# Patient Record
Sex: Female | Born: 1935 | Race: Black or African American | Hispanic: No | Marital: Single | Smoking: Never smoker
Health system: Southern US, Community
[De-identification: ages and names within clinical notes are randomized; demographics above are authoritative.]

## PROBLEM LIST (undated history)

## (undated) DIAGNOSIS — G629 Polyneuropathy, unspecified: Secondary | ICD-10-CM

## (undated) DIAGNOSIS — I509 Heart failure, unspecified: Secondary | ICD-10-CM

## (undated) DIAGNOSIS — D649 Anemia, unspecified: Secondary | ICD-10-CM

## (undated) DIAGNOSIS — F329 Major depressive disorder, single episode, unspecified: Secondary | ICD-10-CM

## (undated) DIAGNOSIS — E7800 Pure hypercholesterolemia, unspecified: Secondary | ICD-10-CM

## (undated) DIAGNOSIS — F32A Depression, unspecified: Secondary | ICD-10-CM

## (undated) DIAGNOSIS — R569 Unspecified convulsions: Secondary | ICD-10-CM

## (undated) DIAGNOSIS — M1990 Unspecified osteoarthritis, unspecified site: Secondary | ICD-10-CM

## (undated) DIAGNOSIS — F0390 Unspecified dementia without behavioral disturbance: Secondary | ICD-10-CM

---

## 2018-05-30 ENCOUNTER — Emergency Department (HOSPITAL_COMMUNITY)
Admission: EM | Admit: 2018-05-30 | Discharge: 2018-05-30 | Disposition: A | Discharge status: Home / Self Care | Payer: Medicare Other | Attending: Emergency Medicine | Admitting: Emergency Medicine

## 2018-05-30 ENCOUNTER — Emergency Department (HOSPITAL_COMMUNITY): Payer: Medicare Other

## 2018-05-30 ENCOUNTER — Encounter (HOSPITAL_COMMUNITY): Payer: Self-pay | Admitting: Emergency Medicine

## 2018-05-30 ENCOUNTER — Other Ambulatory Visit: Payer: Self-pay

## 2018-05-30 DIAGNOSIS — F0390 Unspecified dementia without behavioral disturbance: Secondary | ICD-10-CM | POA: Diagnosis not present

## 2018-05-30 DIAGNOSIS — R2242 Localized swelling, mass and lump, left lower limb: Secondary | ICD-10-CM | POA: Diagnosis present

## 2018-05-30 DIAGNOSIS — I509 Heart failure, unspecified: Secondary | ICD-10-CM | POA: Insufficient documentation

## 2018-05-30 DIAGNOSIS — M7989 Other specified soft tissue disorders: Secondary | ICD-10-CM

## 2018-05-30 DIAGNOSIS — L97421 Non-pressure chronic ulcer of left heel and midfoot limited to breakdown of skin: Secondary | ICD-10-CM | POA: Diagnosis not present

## 2018-05-30 HISTORY — DX: Heart failure, unspecified: I50.9

## 2018-05-30 HISTORY — DX: Pure hypercholesterolemia, unspecified: E78.00

## 2018-05-30 HISTORY — DX: Unspecified osteoarthritis, unspecified site: M19.90

## 2018-05-30 HISTORY — DX: Polyneuropathy, unspecified: G62.9

## 2018-05-30 HISTORY — DX: Major depressive disorder, single episode, unspecified: F32.9

## 2018-05-30 HISTORY — DX: Unspecified dementia, unspecified severity, without behavioral disturbance, psychotic disturbance, mood disturbance, and anxiety: F03.90

## 2018-05-30 HISTORY — DX: Unspecified convulsions: R56.9

## 2018-05-30 HISTORY — DX: Depression, unspecified: F32.A

## 2018-05-30 HISTORY — DX: Anemia, unspecified: D64.9

## 2018-05-30 LAB — CBC WITH DIFFERENTIAL/PLATELET
BASOS PCT: 0 %
Basophils Absolute: 0 10*3/uL (ref 0.0–0.1)
EOS ABS: 0.5 10*3/uL (ref 0.0–0.7)
EOS PCT: 5 %
HCT: 30.3 % — ABNORMAL LOW (ref 36.0–46.0)
Hemoglobin: 9.1 g/dL — ABNORMAL LOW (ref 12.0–15.0)
LYMPHS PCT: 40 %
Lymphs Abs: 3.9 10*3/uL (ref 0.7–4.0)
MCH: 25.7 pg — ABNORMAL LOW (ref 26.0–34.0)
MCHC: 30 g/dL (ref 30.0–36.0)
MCV: 85.6 fL (ref 78.0–100.0)
MONO ABS: 0.7 10*3/uL (ref 0.1–1.0)
Monocytes Relative: 7 %
Neutro Abs: 4.6 10*3/uL (ref 1.7–7.7)
Neutrophils Relative %: 48 %
Platelets: 355 10*3/uL (ref 150–400)
RBC: 3.54 MIL/uL — AB (ref 3.87–5.11)
RDW: 16.3 % — AB (ref 11.5–15.5)
WBC: 9.6 10*3/uL (ref 4.0–10.5)

## 2018-05-30 LAB — BASIC METABOLIC PANEL
Anion gap: 11 (ref 5–15)
BUN: 26 mg/dL — AB (ref 6–20)
CALCIUM: 9 mg/dL (ref 8.9–10.3)
CO2: 32 mmol/L (ref 22–32)
Chloride: 102 mmol/L (ref 101–111)
Creatinine, Ser: 0.87 mg/dL (ref 0.44–1.00)
GFR calc Af Amer: >60 mL/min (ref >60)
GFR calc non Af Amer: 59 mL/min — ABNORMAL LOW (ref >60)
GLUCOSE: 97 mg/dL (ref 65–99)
Potassium: 3.9 mmol/L (ref 3.5–5.1)
SODIUM: 145 mmol/L (ref 135–145)

## 2018-05-30 MED ORDER — GABAPENTIN 300 MG PO CAPS
300.0000 mg | ORAL_CAPSULE | Freq: Once | ORAL | Status: AC
  Administered 2018-05-30: 300 mg via ORAL
  Filled 2018-05-30: qty 1

## 2018-05-30 NOTE — Discharge Instructions (Signed)
You were seen in the ED today with foot swelling near an ulcer. There is no evidence of infection at this time. Labs and x-rays were normal. We performed an ultrasound to look for blood clots in the leg and that was normal as well. Continue your wound care and follow up with the home wound care nurse for further treatment. Return to the ED with any fever, chills, worsening pain, wound drainage, chest pain, or difficulty breathing.

## 2018-05-30 NOTE — ED Provider Notes (Signed)
Emergency Department Provider Note   I have reviewed the triage vital signs and the nursing notes.   HISTORY  Chief Complaint Wound Check   HPI Emily Moses is a 84 y.o. female with PMH of CHF, Dementia, Depression, HLD, and neuropathy presents to the emergency department for evaluation of left heel ulcer.  The patient was apparently evaluated by her wound care nurse this morning and sent to the emergency department.  The patient is having pain in the area which is worsening.  She denies fevers but has noticed some left leg swelling no radiation of symptoms or modifying factors. Denies any drainage from the wound.   Level 5 caveat: Dementia and speech difficulty.   Past Medical History:  Diagnosis Date  . Anemia   . Arthritis   . CHF (congestive heart failure) (HCC)   . Dementia   . Depression   . High cholesterol   . Neuropathy   . Seizures (HCC)     There are no active problems to display for this patient.   Allergies Patient has no known allergies.  No family history on file.  Social History Social History   Tobacco Use  . Smoking status: Never Smoker  . Smokeless tobacco: Never Used  Substance Use Topics  . Alcohol use: Not Currently  . Drug use: Not Currently    Review of Systems  Constitutional: No fever/chills Eyes: No visual changes. ENT: No sore throat. Cardiovascular: Denies chest pain. Respiratory: Denies shortness of breath. Gastrointestinal: No abdominal pain.  No nausea, no vomiting.  No diarrhea.  No constipation. Genitourinary: Negative for dysuria. Musculoskeletal: Negative for back pain. Positive left heel pain and ulcer.  Skin: Negative for rash. Neurological: Negative for headaches, focal weakness or numbness.  10-point ROS otherwise negative.  ____________________________________________   PHYSICAL EXAM:  VITAL SIGNS: ED Triage Vitals  Enc Vitals Group     BP 05/30/18 1452 116/79     Pulse Rate 05/30/18 1452 81   Resp 05/30/18 1452 18     Temp 05/30/18 1452 98.1 F (36.7 C)     Temp src --      SpO2 05/30/18 1452 93 %     Weight 05/30/18 1453 175 lb (79.4 kg)     Height 05/30/18 1453 5\' 2"  (1.575 m)     Pain Score 05/30/18 1452 10   Constitutional: Alert and oriented. Well appearing and in no acute distress. Eyes: Conjunctivae are normal. Head: Atraumatic. Nose: No congestion/rhinnorhea. Mouth/Throat: Mucous membranes are moist.  Neck: No stridor.  Cardiovascular: Normal rate, regular rhythm. Good peripheral circulation. Grossly normal heart sounds.   Respiratory: Normal respiratory effort.  No retractions. Lungs CTAB. Gastrointestinal: Soft and nontender. No distention.  Musculoskeletal: No lower extremity tenderness. Positive left leg edema. 3 x 2 cm heel ulceration with eschar. No surrounding cellulitis or abscess appreciated. No visible bone.  Neurologic:  Normal speech and language. No gross focal neurologic deficits are appreciated.  Skin:  Skin is warm and dry. Left heel ulceration and eschar as noted above.   ____________________________________________   LABS (all labs ordered are listed, but only abnormal results are displayed)  Labs Reviewed  BASIC METABOLIC PANEL - Abnormal; Notable for the following components:      Result Value   BUN 26 (*)    GFR calc non Af Amer 59 (*)    All other components within normal limits  CBC WITH DIFFERENTIAL/PLATELET - Abnormal; Notable for the following components:   RBC 3.54 (*)  Hemoglobin 9.1 (*)    HCT 30.3 (*)    MCH 25.7 (*)    RDW 16.3 (*)    All other components within normal limits   ____________________________________________  RADIOLOGY  [place] Venous Img Lower  Left (dvt Study)  Result Date: 05/30/2018 CLINICAL DATA:  Left lower extremity swelling. EXAM: Left LOWER EXTREMITY VENOUS DOPPLER ULTRASOUND TECHNIQUE: Gray-scale sonography with graded compression, as well as color Doppler and duplex ultrasound were performed to  evaluate the lower extremity deep venous systems from the level of the common femoral vein and including the common femoral, femoral, profunda femoral, popliteal and calf veins including the posterior tibial, peroneal and gastrocnemius veins when visible. The superficial great saphenous vein was also interrogated. Spectral Doppler was utilized to evaluate flow at rest and with distal augmentation maneuvers in the common femoral, femoral and popliteal veins. COMPARISON:  None. FINDINGS: Contralateral Common Femoral Vein: Respiratory phasicity is normal and symmetric with the symptomatic side. No evidence of thrombus. Normal compressibility. Common Femoral Vein: No evidence of thrombus. Normal compressibility, respiratory phasicity and response to augmentation. Saphenofemoral Junction: No evidence of thrombus. Normal compressibility and flow on color Doppler imaging. Profunda Femoral Vein: No evidence of thrombus. Normal compressibility and flow on color Doppler imaging. Femoral Vein: No evidence of thrombus. Normal compressibility, respiratory phasicity and response to augmentation. Popliteal Vein: No evidence of thrombus. Normal compressibility, respiratory phasicity and response to augmentation. Calf Veins: No evidence of thrombus. Normal compressibility and flow on color Doppler imaging. Superficial Great Saphenous Vein: No evidence of thrombus. Normal compressibility. Venous Reflux:  None. Other Findings:  Soft tissue edema is noted in the calf. IMPRESSION: No definite evidence of deep venous thrombosis seen in the left lower extremity. Electronically Signed   By: Lupita RaiderJames  Green Jr, M.D.   On: 05/30/2018 17:49   Dg Foot Complete Left  Result Date: 05/30/2018 CLINICAL DATA:  Left heel ulceration, assess for osteomyelitis. EXAM: LEFT FOOT - COMPLETE 3+ VIEW COMPARISON:  None. FINDINGS: There is no evidence of fracture or dislocation. Osteopenia. There is no evidence of arthropathy or other focal bone abnormality.  Soft tissue swelling. No subcutaneous gas or radiopaque foreign bodies. Mild vascular calcifications. IMPRESSION: Soft tissue swelling. No acute osseous process. Osteopenia without destructive bony lesions. Electronically Signed   By: Awilda Metroourtnay  Bloomer M.D.   On: 05/30/2018 16:17    ____________________________________________   PROCEDURES  Procedure(s) performed:   Procedures  None  ____________________________________________   INITIAL IMPRESSION / ASSESSMENT AND PLAN / ED COURSE  Pertinent labs & imaging results that were available during my care of the patient were reviewed by me and considered in my medical decision making (see chart for details).  Patient presents to the emergency department for evaluation of left heel ulcer.  She was apparently sent by her wound care nurse.  The wound does not appear acutely infected but unclear if ulcer extends to bone.  Plan for baseline labs and plain film to evaluate for osteomyelitis.  Plan to also obtain lower extremity ultrasound given the worsening swelling to rule out DVT.   Spoke with [facility] nurse at [facility].  She reports that the wound care nurse was concerned regarding the left foot swelling in the setting of known ulcer and devised ED presentation to rule out infection.  Lab work to this point is normal.  No leukocytosis.  X-ray of the foot shows no bony destruction or concern for osteomyelitis.  My clinical concern for osteomyelitis is low.  The patient has no  outward signs of acute wound infection.  Ultrasound of the left lower extremity is pending.   No evidence of DVT on [place]. Plan for discharge with continued wound mgmt. Provided written discharge instructions and summary of ED care at discharge.   ____________________________________________  FINAL CLINICAL IMPRESSION(S) / ED DIAGNOSES  Final diagnoses:  Swelling of left foot  Skin ulcer of left heel, limited to breakdown of skin (HCC)    MEDICATIONS GIVEN DURING THIS  VISIT:  Medications  gabapentin (NEURONTIN) capsule 300 mg (300 mg Oral Given 05/30/18 1558)    Note:  This document was prepared using Dragon voice recognition software and may include unintentional dictation errors.  Alona Bene, MD Emergency Medicine    Mikaia Janvier, Arlyss Repress, MD 05/30/18 [phone]

## 2018-05-30 NOTE — ED Triage Notes (Signed)
Pt sent to ED by wound care nurse for left heel wound.

## 2018-08-01 IMAGING — US US EXTREM LOW VENOUS*L*
1 series · 13 of 16 positions shown · non-contrast
Comparison: None.

CLINICAL DATA: Left lower extremity swelling.



[Series 1: us extrem low venous*left* · 0.08mm/px · 13 of 16 slices shown]
[im 1/16]
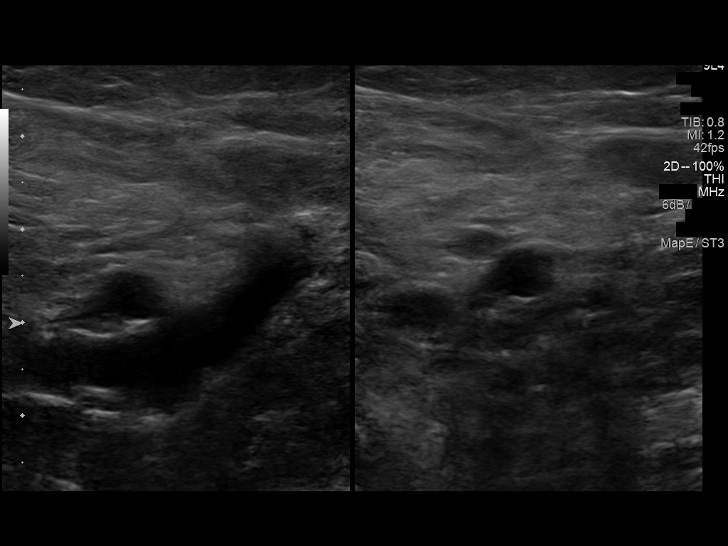
[im 2/16]
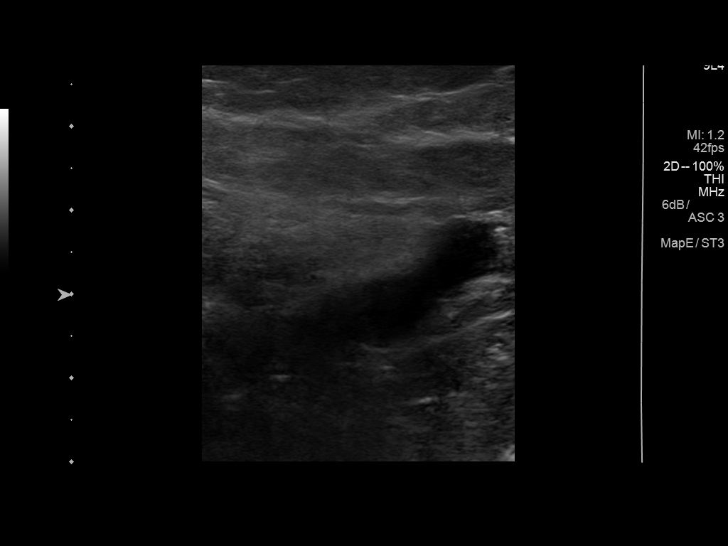
[im 4/16]
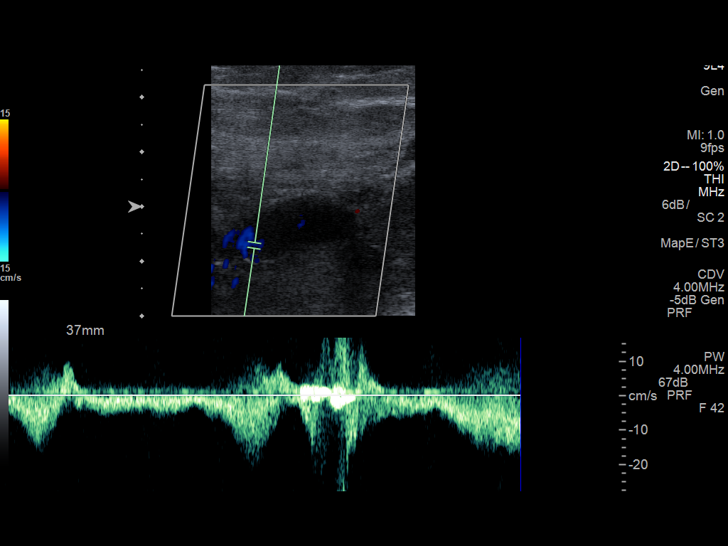
[im 5/16]
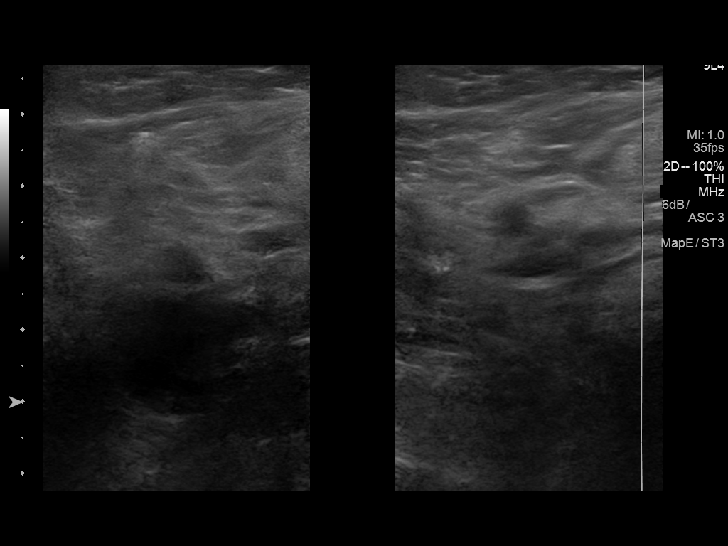
[im 6/16]
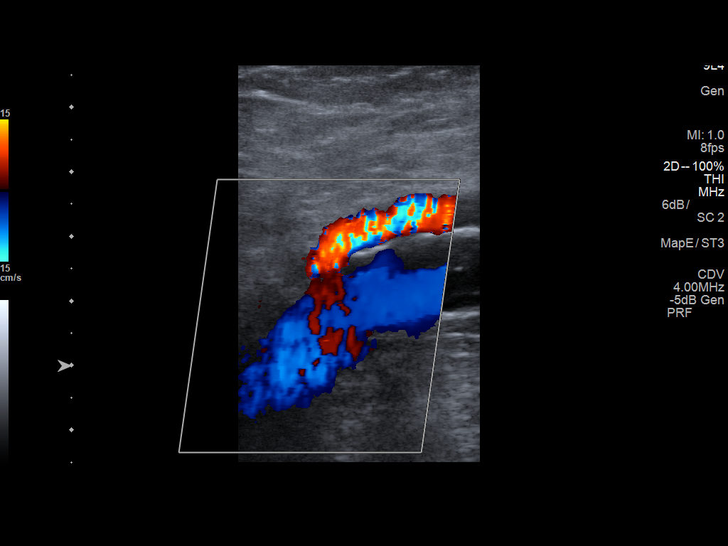
[im 7/16]
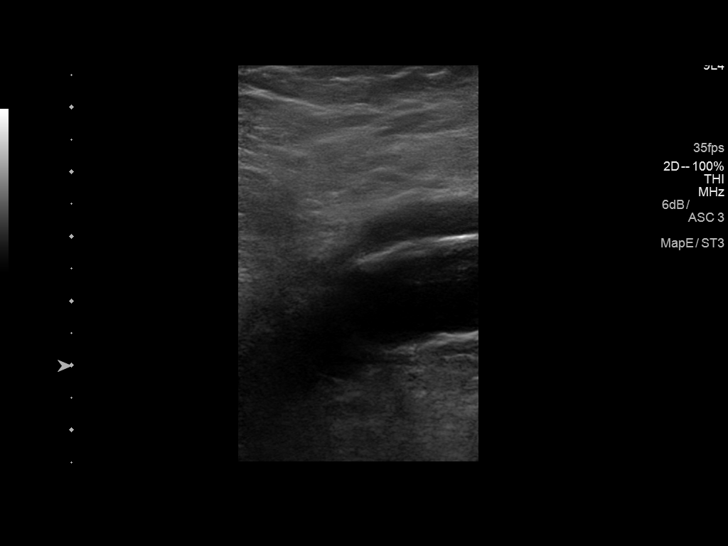
[im 9/16]
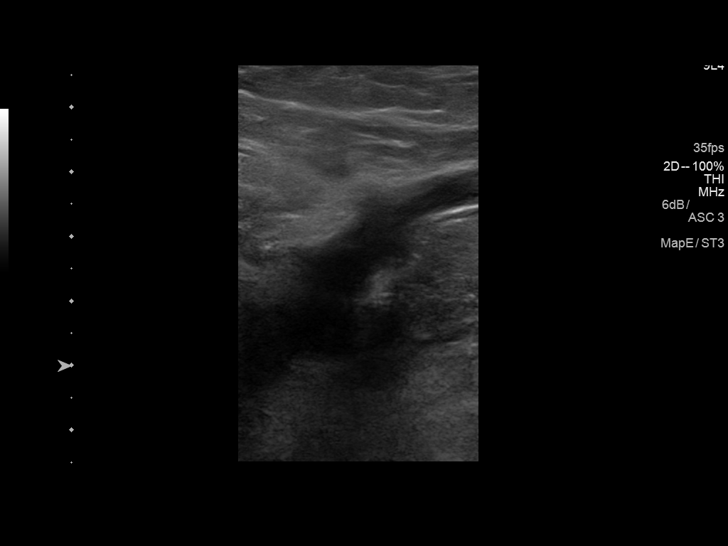
[im 10/16]
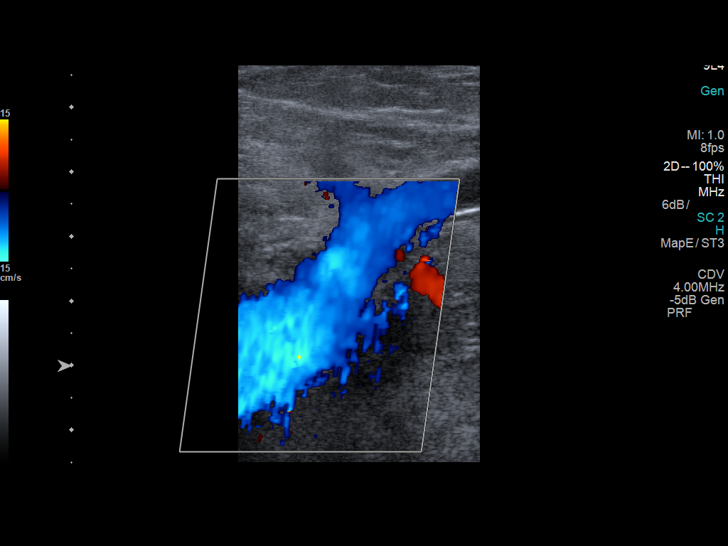
[im 11/16]
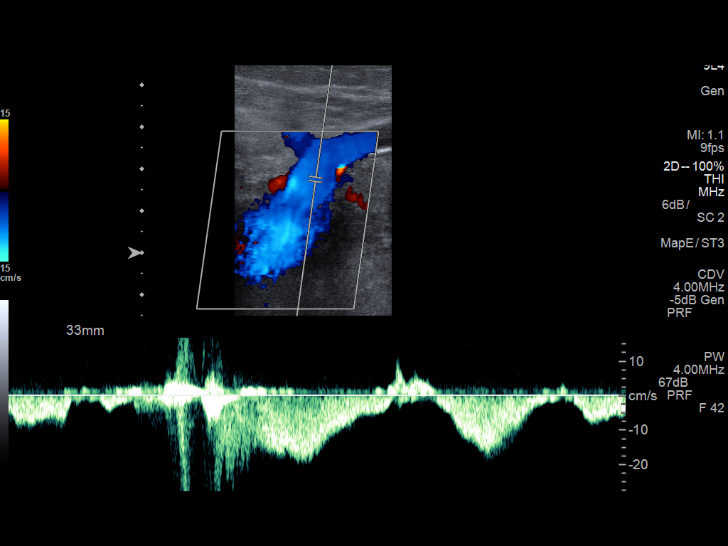
[im 12/16]
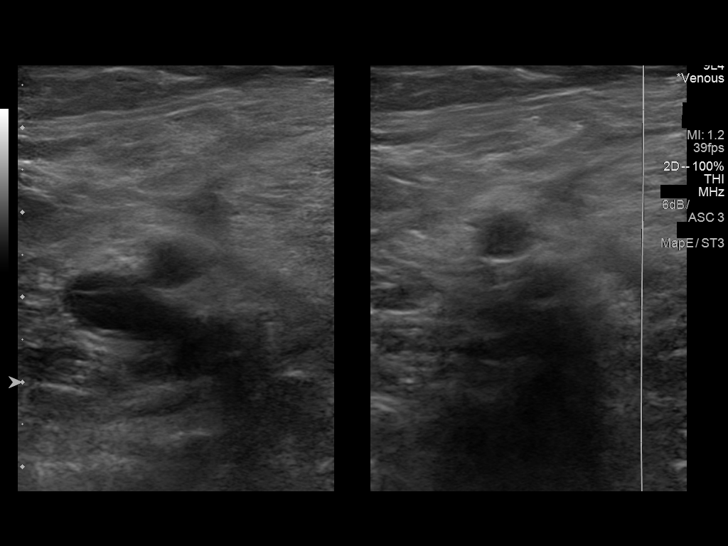
[im 13/16]
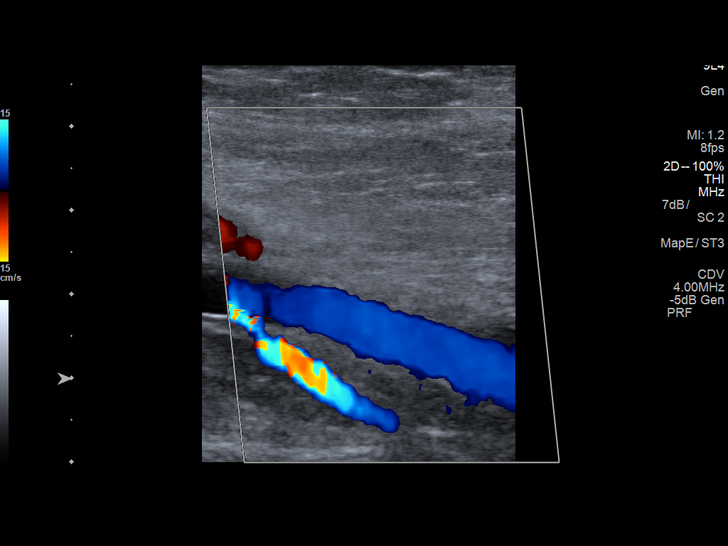
[im 15/16]
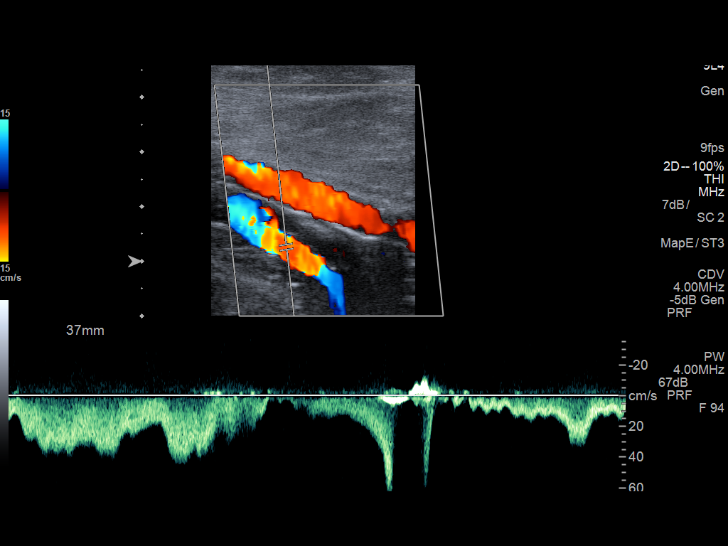
[im 16/16]
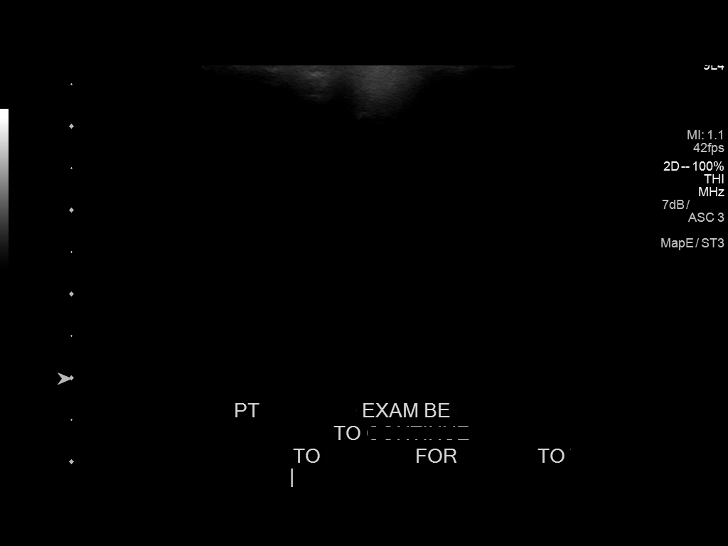

[13 of 16 positions shown; findings below may reference images not displayed]

FINDINGS: Contralateral Common Femoral Vein: Respiratory phasicity is normal
and symmetric with the symptomatic side. No evidence of thrombus.
Normal compressibility.

Common Femoral Vein: No evidence of thrombus. Normal
compressibility, respiratory phasicity and response to augmentation.

Saphenofemoral Junction: No evidence of thrombus. Normal
compressibility and flow on color Doppler imaging.

Profunda Femoral Vein: No evidence of thrombus. Normal
compressibility and flow on color Doppler imaging.

Femoral Vein: No evidence of thrombus. Normal compressibility,
respiratory phasicity and response to augmentation.

Popliteal Vein: No evidence of thrombus. Normal compressibility,
respiratory phasicity and response to augmentation.

Calf Veins: No evidence of thrombus. Normal compressibility and flow
on color Doppler imaging.

Superficial Great Saphenous Vein: No evidence of thrombus. Normal
compressibility.

Venous Reflux:  None.

Other Findings:  Soft tissue edema is noted in the calf.
IMPRESSION: No definite evidence of deep venous thrombosis seen in the left
lower extremity.

## 2019-03-27 DEATH — deceased
